# Patient Record
Sex: Female | Born: 1965 | Race: White | Hispanic: No | Marital: Single | Smoking: Never smoker
Health system: Southern US, Community
[De-identification: ages and names within clinical notes are randomized; demographics above are authoritative.]

## PROBLEM LIST (undated history)

## (undated) DIAGNOSIS — D649 Anemia, unspecified: Secondary | ICD-10-CM

## (undated) DIAGNOSIS — K649 Unspecified hemorrhoids: Secondary | ICD-10-CM

## (undated) DIAGNOSIS — E039 Hypothyroidism, unspecified: Secondary | ICD-10-CM

## (undated) HISTORY — DX: Anemia, unspecified: D64.9

## (undated) HISTORY — DX: Hypothyroidism, unspecified: E03.9

## (undated) HISTORY — DX: Unspecified hemorrhoids: K64.9

---

## 2001-08-26 HISTORY — PX: VAGINAL HYSTERECTOMY: SUR661

## 2016-07-01 ENCOUNTER — Encounter: Payer: Self-pay | Admitting: *Deleted

## 2016-07-03 ENCOUNTER — Telehealth: Payer: Self-pay

## 2016-07-03 NOTE — Telephone Encounter (Signed)
PT RECEIVED LETTER TO SCHEDULE TCS

## 2016-07-05 NOTE — Telephone Encounter (Signed)
I called pt and she is busy and will call Monday.

## 2016-07-11 ENCOUNTER — Encounter (INDEPENDENT_AMBULATORY_CARE_PROVIDER_SITE_OTHER): Payer: Self-pay

## 2016-07-11 ENCOUNTER — Encounter: Payer: Self-pay | Admitting: Adult Health

## 2016-07-11 ENCOUNTER — Ambulatory Visit (INDEPENDENT_AMBULATORY_CARE_PROVIDER_SITE_OTHER): Payer: BC Managed Care – PPO | Admitting: Adult Health

## 2016-07-11 VITALS — BP 112/82 | HR 60 | Ht 65.0 in | Wt 171.5 lb

## 2016-07-11 DIAGNOSIS — Z01419 Encounter for gynecological examination (general) (routine) without abnormal findings: Secondary | ICD-10-CM | POA: Diagnosis not present

## 2016-07-11 DIAGNOSIS — Z1212 Encounter for screening for malignant neoplasm of rectum: Secondary | ICD-10-CM | POA: Diagnosis not present

## 2016-07-11 DIAGNOSIS — R195 Other fecal abnormalities: Secondary | ICD-10-CM | POA: Diagnosis not present

## 2016-07-11 DIAGNOSIS — N951 Menopausal and female climacteric states: Secondary | ICD-10-CM

## 2016-07-11 LAB — HEMOCCULT GUIAC POC 1CARD (OFFICE): FECAL OCCULT BLD: POSITIVE — AB

## 2016-07-11 NOTE — Telephone Encounter (Signed)
Pt called back. She had a positive hemoccult at the PCP this week. OV with Walden Field, NP on 07/15/2016 at 1:30 Pm.

## 2016-07-11 NOTE — Patient Instructions (Addendum)
Physical in 1 year Mammogram yearly Labs with PCP Call me back with GI's name  Menopause Menopause is the normal time of life when menstrual periods stop completely. Menopause is complete when you have missed 12 consecutive menstrual periods. It usually occurs between the ages of 32 years and 36 years. Very rarely does a woman develop menopause before the age of 47 years. At menopause, your ovaries stop producing the female hormones estrogen and progesterone. This can cause undesirable symptoms and also affect your health. Sometimes the symptoms may occur 4-5 years before the menopause begins. There is no relationship between menopause and:  Oral contraceptives.  Number of children you had.  Race.  The age your menstrual periods started (menarche). Heavy smokers and very thin women may develop menopause earlier in life. What are the causes?  The ovaries stop producing the female hormones estrogen and progesterone. Other causes include:  Surgery to remove both ovaries.  The ovaries stop functioning for no known reason.  Tumors of the pituitary gland in the brain.  Medical disease that affects the ovaries and hormone production.  Radiation treatment to the abdomen or pelvis.  Chemotherapy that affects the ovaries. What are the signs or symptoms?  Hot flashes.  Night sweats.  Decrease in sex drive.  Vaginal dryness and thinning of the vagina causing painful intercourse.  Dryness of the skin and developing wrinkles.  Headaches.  Tiredness.  Irritability.  Memory problems.  Weight gain.  Bladder infections.  Hair growth of the face and chest.  Infertility. More serious symptoms include:  Loss of bone (osteoporosis) causing breaks (fractures).  Depression.  Hardening and narrowing of the arteries (atherosclerosis) causing heart attacks and strokes. How is this diagnosed?  When the menstrual periods have stopped for 12 straight months.  Physical  exam.  Hormone studies of the blood. How is this treated? There are many treatment choices and nearly as many questions about them. The decisions to treat or not to treat menopausal changes is an individual choice made with your health care provider. Your health care provider can discuss the treatments with you. Together, you can decide which treatment will work best for you. Your treatment choices may include:  Hormone therapy (estrogen and progesterone).  Non-hormonal medicines.  Treating the individual symptoms with medicine (for example antidepressants for depression).  Herbal medicines that may help specific symptoms.  Counseling by a psychiatrist or psychologist.  Group therapy.  Lifestyle changes including:  Eating healthy.  Regular exercise.  Limiting caffeine and alcohol.  Stress management and meditation.  No treatment. Follow these instructions at home:  Take the medicine your health care provider gives you as directed.  Get plenty of sleep and rest.  Exercise regularly.  Eat a diet that contains calcium (good for the bones) and soy products (acts like estrogen hormone).  Avoid alcoholic beverages.  Do not smoke.  If you have hot flashes, dress in layers.  Take supplements, calcium, and vitamin D to strengthen bones.  You can use over-the-counter lubricants or moisturizers for vaginal dryness.  Group therapy is sometimes very helpful.  Acupuncture may be helpful in some cases. Contact a health care provider if:  You are not sure you are in menopause.  You are having menopausal symptoms and need advice and treatment.  You are still having menstrual periods after age 60 years.  You have pain with intercourse.  Menopause is complete (no menstrual period for 12 months) and you develop vaginal bleeding.  You need a referral  to a specialist (gynecologist, psychiatrist, or psychologist) for treatment. Get help right away if:  You have severe  depression.  You have excessive vaginal bleeding.  You fell and think you have a broken bone.  You have pain when you urinate.  You develop leg or chest pain.  You have a fast pounding heart beat (palpitations).  You have severe headaches.  You develop vision problems.  You feel a lump in your breast.  You have abdominal pain or severe indigestion. This information is not intended to replace advice given to you by your health care provider. Make sure you discuss any questions you have with your health care provider. Document Released: 11/02/2003 Document Revised: 01/18/2016 Document Reviewed: 03/11/2013 Elsevier Interactive Patient Education  2017 [employer].

## 2016-07-11 NOTE — Telephone Encounter (Signed)
Pt is not where she can talk. She will call back this afternoon.

## 2016-07-11 NOTE — Progress Notes (Signed)
Patient ID: Kimberly Daugherty, female   DOB: September 29, 1965, 50 y.o.   MRN: [id number] History of Present Illness: Kimberly Daugherty is a 50 year old white female, new to this practice, in for well woman gyn exam, she is sp hysterectomy for fibroids about 12 years ago. 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   PCP Arsenio Katz, NP.   Current Medications, Allergies, Past Medical History, Past Surgical History, Family History and Social History were reviewed in [employer] record.     Review of Systems: Patient denies any headaches, hearing loss, fatigue, blurred vision, shortness of breath, chest pain, abdominal pain, problems with bowel movements, urination, or intercourse(not having sex). No joint pain or mood swings. + hot flashes, + weight gain, not sleeping well   Physical Exam:BP 112/82 (BP Location: Left Arm, Patient Position: Sitting, Cuff Size: Normal)   Pulse 60   Ht 5\' 5"  (1.651 m)   Wt 171 lb 8 oz (77.8 kg)   BMI 28.54 kg/m  General:  Well developed, well nourished, no acute distress Skin:  Warm and dry Neck:  Midline trachea, normal thyroid, good ROM, no lymphadenopathy Lungs; Clear to auscultation bilaterally Breast:  No dominant palpable mass, retraction, or nipple discharge Cardiovascular: Regular rate and rhythm Abdomen:  Soft, non tender, no hepatosplenomegaly Pelvic:  External genitalia is normal in appearance, no lesions.  The vagina is normal in appearance. Urethra has no lesions or masses. The cervix and uterus are absent.  No adnexal masses or tenderness noted.Bladder is non tender, no masses felt. Rectal: Good sphincter tone, no polyps, or hemorrhoids felt.  Hemoccult  positive. Extremities/musculoskeletal:  No swelling or varicosities noted, no clubbing or cyanosis Psych:  No mood changes, alert and cooperative,seems happy PHQ 2 score 0 Discussed risks and benefits of estrogen therapy and she declines for now, she wil call me back with name of GI so I can make them aware of + hemoccult.   Impression: 1. Well woman exam with routine gynecological exam   2. Positive fecal occult blood test   3. Hot flashes due to menopause       Plan: Physical in 1 year Mammogram yearly Labs with PCP Call me back with GI's name Review handout on menopause She declines flu shot, but I recommend shingles vaccine, will talk with PCP

## 2016-07-15 ENCOUNTER — Other Ambulatory Visit: Payer: Self-pay

## 2016-07-15 ENCOUNTER — Encounter: Payer: Self-pay | Admitting: Nurse Practitioner

## 2016-07-15 ENCOUNTER — Ambulatory Visit (INDEPENDENT_AMBULATORY_CARE_PROVIDER_SITE_OTHER): Payer: BC Managed Care – PPO | Admitting: Nurse Practitioner

## 2016-07-15 DIAGNOSIS — K921 Melena: Secondary | ICD-10-CM

## 2016-07-15 DIAGNOSIS — R195 Other fecal abnormalities: Secondary | ICD-10-CM | POA: Diagnosis not present

## 2016-07-15 MED ORDER — PEG-KCL-NACL-NASULF-NA ASC-C 100 G PO SOLR: 1.0000 | ORAL | 0 refills | Status: DC

## 2016-07-15 NOTE — Progress Notes (Signed)
cc'ed to pcp °

## 2016-07-15 NOTE — Progress Notes (Signed)
Primary Care Physician:  [facility] [facility] Primary Gastroenterologist:  Dr. Oneida Alar  Chief Complaint  Patient presents with  . Colonoscopy    never had TCS  . Blood In Stools    tested + at OBGYN    HPI:   Kimberly Daugherty is a 50 y.o. female who presents On referral from gynecology/primary care for colonoscopy after she received a letter to call and schedule an appointment. She is never had a colonoscopy before. Apparently, she tested positive for heme in her stool at her gynecologist on 07/11/2016. She is not aware of any problems.  Today she states she's doing well overall. Had heme+ stool at her GYN. At the time she felt dehydrated. No overt hemorrhoid symptoms but "on occasion it feels different so I may?" Denies abdominal pain, N/V, hematochezia, melena, fever, chills, unintentional weight loss. Bowel movements typically 3-4 on Bristol scale. Has a bowel movement about once a day. Denies chest pain, dyspnea, dizziness, lightheadedness, syncope, near syncope. Denies any other upper or lower GI symptoms.  Past Medical History:  Diagnosis Date  . Hypothyroid     Past Surgical History:  Procedure Laterality Date  . VAGINAL HYSTERECTOMY  2003    Current Outpatient Prescriptions  Medication Sig Dispense Refill  . levothyroxine (SYNTHROID, LEVOTHROID) 25 MCG tablet Take 25 mcg by mouth daily before breakfast.     No current facility-administered medications for this visit.     Allergies as of 07/15/2016  . (No Known Allergies)    Family History  Problem Relation Age of Onset  . Cancer Father     lung  . Leukemia Mother   . Other Mother     platelet disorder  . Hypothyroidism Sister   . Stroke Maternal Grandmother   . Stroke Maternal Grandfather   . Colon cancer Neg Hx     Social History   Social History  . Marital status: Single    Spouse name: N/A  . Number of children: N/A  . Years of education: N/A   Occupational History  . Not on file.   Social  History Main Topics  . Smoking status: Never Smoker  . Smokeless tobacco: Never Used  . Alcohol use Yes     Comment: Occasional, about once a week 1-2 drinks per sitting.  . Drug use: No  . Sexual activity: Not Currently    Birth control/ protection: Surgical     Comment: hyst   Other Topics Concern  . Not on file   Social History Narrative  . No narrative on file    Review of Systems: Complete ROS negative except as per HPI.    Physical Exam: BP (!) 138/95   Pulse 92   Temp 97.6 F (36.4 C) (Oral)   Ht 5\' 5"  (1.651 m)   Wt 171 lb 12.8 oz (77.9 kg)   BMI 28.59 kg/m  General:   Alert and oriented. Pleasant and cooperative. Well-nourished and well-developed.  Head:  Normocephalic and atraumatic. Eyes:  Without icterus, sclera clear and conjunctiva pink.  Ears:  Normal auditory acuity. Cardiovascular:  S1, S2 present without murmurs appreciated. Extremities without clubbing or edema. Respiratory:  Clear to auscultation bilaterally. No wheezes, rales, or rhonchi. No distress.  Gastrointestinal:  +BS, rounded but soft, non-tender and non-distended. No HSM noted. No guarding or rebound. No masses appreciated.  Rectal:  Deferred  Musculoskalatal:  Symmetrical without gross deformities. Neurologic:  Alert and oriented x4;  grossly normal neurologically. Psych:  Alert and cooperative.  Normal mood and affect. Heme/Lymph/Immune: No excessive bruising noted.    07/15/2016 1:57 PM   Disclaimer: This note was dictated with voice recognition software. Similar sounding words can inadvertently be transcribed and may not be corrected upon review.

## 2016-07-15 NOTE — Assessment & Plan Note (Signed)
The patient was initially scheduled for a phone triage based first-ever screening colonoscopy. However, she had heme positive stool at her gynecologist and an appointment was made. She denies hemorrhoid symptoms but states "I could have the my guess because sometimes thinks just feel not right." Has daily bowel movements consistent with Bristol 3-4. Denies drug when with constipation. Heme positive stool likely benign anorectal source but cannot rule out more insidious pathology. We will schedule colonoscopy at this time.  Proceed with colonoscopy with Dr. Oneida Alar in the near future. The risks, benefits, and alternatives have been discussed in detail with the patient. They state understanding and desire to proceed.   The patient is not on any anticoagulants, anxiolytics, chronic pain medications, or antidepressants. Conscious sedation should be adequate for her procedure.

## 2016-07-15 NOTE — Patient Instructions (Signed)
1. We will schedule your procedure for you. 2. Further recommendations to be made after your procedure. 3. Return for follow-up based on postprocedure recommendations.    Have a Happy Thanksgiving!!!

## 2016-07-24 ENCOUNTER — Telehealth: Payer: Self-pay | Admitting: Adult Health

## 2016-07-24 NOTE — Telephone Encounter (Signed)
Pt has appt for colonoscopy 12/18 with Dr Oneida Alar

## 2016-08-09 ENCOUNTER — Telehealth: Payer: Self-pay

## 2016-08-09 NOTE — Telephone Encounter (Signed)
Noted  

## 2016-08-09 NOTE — Telephone Encounter (Signed)
Pt left message on machine to inquire about her TCS. I tried to call her back but I had to leave a massage

## 2016-08-09 NOTE — Telephone Encounter (Signed)
Pt called and she would like to cancel her TCS because she will have to pay $1600.00. She stated that she has never had a TCS before so it should be a screening. I told her that she had blood in her stool that is why it is not a screening.

## 2016-08-12 ENCOUNTER — Encounter (HOSPITAL_COMMUNITY): Admission: RE | Payer: Self-pay | Source: Ambulatory Visit

## 2016-08-12 ENCOUNTER — Ambulatory Visit (HOSPITAL_COMMUNITY)
Admission: RE | Admit: 2016-08-12 | Payer: BC Managed Care – PPO | Source: Ambulatory Visit | Admitting: Gastroenterology

## 2016-08-12 SURGERY — COLONOSCOPY
Anesthesia: Moderate Sedation

## 2016-12-30 ENCOUNTER — Ambulatory Visit: Payer: BC Managed Care – PPO | Admitting: Orthopedic Surgery

## 2017-01-06 ENCOUNTER — Ambulatory Visit (INDEPENDENT_AMBULATORY_CARE_PROVIDER_SITE_OTHER): Payer: BC Managed Care – PPO | Admitting: Orthopedic Surgery

## 2017-01-06 ENCOUNTER — Encounter: Payer: Self-pay | Admitting: Orthopedic Surgery

## 2017-01-06 ENCOUNTER — Ambulatory Visit (INDEPENDENT_AMBULATORY_CARE_PROVIDER_SITE_OTHER): Payer: BC Managed Care – PPO

## 2017-01-06 VITALS — BP 152/83 | HR 74 | Ht 65.0 in | Wt 162.0 lb

## 2017-01-06 DIAGNOSIS — M25561 Pain in right knee: Secondary | ICD-10-CM

## 2017-01-06 DIAGNOSIS — M7051 Other bursitis of knee, right knee: Secondary | ICD-10-CM

## 2017-01-06 NOTE — Progress Notes (Signed)
NEW PATIENT OFFICE VISIT    Chief Complaint  Patient presents with  . 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 She gained some weight and that has some gotten better she lost about 10 pounds in her thyroid levels have finally started to stabilize. She presents to [place] with a rather new onset medial pain over the medial proximal tibia which is dull worse with activity worse after running partially relieved with ibuprofen. She had no major ligament injuries or prior surgeries to the right knee    Review of Systems  Constitutional: Negative for chills, fever and weight loss.  Respiratory: Negative for shortness of breath.   Cardiovascular: Negative for chest pain.  Neurological: Negative for tingling.     Past Medical History:  Diagnosis Date  . Hypothyroid     Past Surgical History:  Procedure Laterality Date  . VAGINAL HYSTERECTOMY  2003    Family History  Problem Relation Age of Onset  . Cancer Father        lung  . Leukemia Mother   . Other Mother        platelet disorder  . Hypothyroidism Sister   . Stroke Maternal Grandmother   . Stroke Maternal Grandfather   . Colon cancer Neg Hx    Social History  Substance Use Topics  . Smoking status: Never Smoker  . Smokeless tobacco: Never Used  . Alcohol use Yes     Comment: Occasional, about once a week 1-2 drinks per sitting.    There were no vitals taken for this visit.  Physical Exam  Constitutional: She is oriented to person, place, and time. She appears well-developed and well-nourished.  Musculoskeletal:       Right knee: She exhibits no effusion.       Left knee: She exhibits no effusion.  Neurological: She is alert and oriented to person, place, and time.  Psychiatric: She has a normal mood and affect.  Vitals reviewed.   Right Knee Exam   Tenderness  The patient is experiencing tenderness in the pes  anserinus.  Range of Motion  Extension: normal  Flexion: normal   Muscle Strength   The patient has normal right knee strength.  Tests  McMurray:  Medial - negative Lateral - negative Drawer:       Anterior - negative    Posterior - negative Varus: negative Valgus: negative  Other  Erythema: absent Scars: absent Sensation: normal Pulse: present Swelling: none Other tests: no effusion present   Left Knee Exam   Tenderness  The patient is experiencing no tenderness.     Range of Motion  Extension: normal  Flexion: normal   Muscle Strength   The patient has normal left knee strength.  Tests  McMurray:  Medial - negative Lateral - negative Drawer:       Anterior - negative     Posterior - negative Varus: negative Valgus: negative  Other  Erythema: absent Scars: absent Sensation: normal Pulse: present Swelling: none Effusion: no effusion present        Encounter Diagnoses  Name Primary?  . Right knee pain, unspecified chronicity   . Pes anserinus bursitis of right knee Yes   Plain films show symmetric mild joint space narrowing medial lateral compartment normal patellofemoral compartment  PLAN:   Alternate running surfaces and running path   Apply ice for 30 min immediately  after running   Use  ibuprofen as needed   Use maxfreeze or biofreeze as needed

## 2017-01-06 NOTE — Patient Instructions (Addendum)
Bursitis Bursitis is inflammation and irritation of a bursa, which is one of the small, fluid-filled sacs that cushion and protect the moving parts of your body. These sacs are located between bones and muscles, muscle attachments, or skin areas next to bones. A bursa protects these structures from the wear and tear that results from frequent movement. An inflamed bursa causes pain and swelling. Fluid may build up inside the sac. Bursitis is most common near joints, especially the knees, elbows, hips, and shoulders. What are the causes? Bursitis can be caused by:  Injury from:  A direct blow, like falling on your knee or elbow.  Overuse of a joint (repetitive stress).  Infection. This can happen if bacteria gets into a bursa through a cut or scrape near a joint.  Diseases that cause joint inflammation, such as gout and rheumatoid arthritis. What increases the risk? You may be at risk for bursitis if you:  Have a job or hobby that involves a lot of repetitive stress on your joints.  Have a condition that weakens your body's defense system (immune system), such as diabetes, cancer, or HIV.  Lift and reach overhead often.  Kneel or lean on hard surfaces often.  Run or walk often. What are the signs or symptoms? The most common signs and symptoms of bursitis are:  Pain that gets worse when you move the affected body part or put weight on it.  Inflammation.  Stiffness. Other signs and symptoms may include:  Redness.  Tenderness.  Warmth.  Pain that continues after rest.  Fever and chills. This may occur in bursitis caused by infection. How is this diagnosed? Bursitis may be diagnosed by:  Medical history and physical exam.  MRI.  A procedure to drain fluid from the bursa with a needle (aspiration). The fluid may be checked for signs of infection or gout.  Blood tests to rule out other causes of inflammation. How is this treated? Bursitis can usually be treated at  home with rest, ice, compression, and elevation (RICE). For mild bursitis, RICE treatment may be all you need. Other treatments may include:  Nonsteroidal anti-inflammatory drugs (NSAIDs) to treat pain and inflammation.  Corticosteroids to fight inflammation. You may have these drugs injected into and around the area of bursitis.  Aspiration of bursitis fluid to relieve pain and improve movement.  Antibiotic medicine to treat an infected bursa.  A splint, brace, or walking aid.  Physical therapy if you continue to have pain or limited movement.  Surgery to remove a damaged or infected bursa. This may be needed if you have a very bad case of bursitis or if other treatments have not worked. Follow these instructions at home:  Take medicines only as directed by your health care provider.  If you were prescribed an antibiotic medicine, finish it all even if you start to feel better.  Rest the affected area as directed by your health care provider.  Keep the area elevated.  Avoid activities that make pain worse.  Apply ice to the injured area:  Place ice in a plastic bag.  Place a towel between your skin and the bag.  Leave the ice on for 20 minutes, 2-3 times a day.  Use splints, braces, pads, or walking aids as directed by your health care provider.  Keep all follow-up visits as directed by your health care provider. This is important. How is this prevented?  Wear knee pads if you kneel often.  Wear sturdy running or walking shoes  that fit you well.  Take regular breaks from repetitive activity.  Warm up by stretching before doing any strenuous activity.  Maintain a healthy weight or lose weight as recommended by your health care provider. Ask your health care provider if you need help.  Exercise regularly. Start any new physical activity gradually. Contact a health care provider if:  Your bursitis is not responding to treatment or home care.  You have a  fever.  You have chills. This information is not intended to replace advice given to you by your health care provider. Make sure you discuss any questions you have with your health care provider. Document Released: 08/09/2000 Document Revised: 01/18/2016 Document Reviewed: 11/01/2013 Elsevier Interactive Patient Education  2017 [employer]   Alternate running surfaces and running path   Apply ice for 30 min immediately  after running   Use ibuprofen as needed   Use maxfreeze or biofreeze as needed

## 2017-06-27 ENCOUNTER — Encounter: Payer: Self-pay | Admitting: "Endocrinology

## 2017-12-04 ENCOUNTER — Encounter: Payer: Self-pay | Admitting: "Endocrinology

## 2018-01-05 ENCOUNTER — Encounter: Payer: Self-pay | Admitting: "Endocrinology

## 2018-01-05 ENCOUNTER — Ambulatory Visit (INDEPENDENT_AMBULATORY_CARE_PROVIDER_SITE_OTHER): Payer: BC Managed Care – PPO | Admitting: "Endocrinology

## 2018-01-05 VITALS — BP 122/78 | HR 59 | Ht 67.25 in | Wt 175.0 lb

## 2018-01-05 DIAGNOSIS — E039 Hypothyroidism, unspecified: Secondary | ICD-10-CM | POA: Diagnosis not present

## 2018-01-05 NOTE — Patient Instructions (Signed)
Advice for Weight Management -For most of [place] the best way to lose weight is by diet management. Generally speaking, diet management means restricting carbohydrate consumption to minimum possible (and to unprocessed or minimally processed complex starch) and increasing protein intake (animal or plant source), fruits, and vegetables. The overall plan is to keep negative energy balance.  -Sticking to a routine mealtime to eat 3 meals a day and avoiding unnecessary snacks is shown to have a big role in weight control.  -It is better to avoid simple carbohydrates including: Cakes, Sweet Desserts, Ice Cream, Soda (diet and regular), Sweet Tea, Candies, Chips, Cookies, Store Bought Juices, Alcohol in Excess of  1-2 drinks a day, Artificial Sweeteners, and "Sugar-free" Products.   -Exercise: 30 -60 minutes a day 3-4 days a week, or 150 minutes a week. Combine stretch, strength, and aerobic activities. You may seek evaluation by your heart doctor prior to initiating exercise if you have high risk for heart disease.  -If you are interested in discussing diet options/exchanges , we  can schedule a visit with Jearld Fenton, RDN, CDE for individualized nutrition education.

## 2018-01-05 NOTE — Progress Notes (Signed)
Endocrinology Consult Note                                            01/05/2018, 5:40 PM   Subjective:    Patient ID: Kimberly Daugherty, female    DOB: 04-18-1966, PCP Monico Blitz, MD   Past Medical History:  Diagnosis Date  . Hypothyroidism    Past Surgical History:  Procedure Laterality Date  . VAGINAL HYSTERECTOMY  2003   Social History   Socioeconomic History  . Marital status: Single    Spouse name: Not on file  . Number of children: Not on file  . Years of education: Not on file  . Highest education level: Not on file  Occupational History  . Not on file  Social Needs  . Financial resource strain: Not on file  . Food insecurity:    Worry: Not on file    Inability: Not on file  . Transportation needs:    Medical: Not on file    Non-medical: Not on file  Tobacco Use  . Smoking status: Never Smoker  . Smokeless tobacco: Never Used  Substance and Sexual Activity  . Alcohol use: Yes    Comment: Occasional, about once a week 1-2 drinks per sitting.  . Drug use: No  . Sexual activity: Not Currently    Birth control/protection: Surgical    Comment: hyst  Lifestyle  . Physical activity:    Days per week: Not on file    Minutes per session: Not on file  . Stress: Not on file  Relationships  . Social connections:    Talks on phone: Not on file    Gets together: Not on file    Attends religious service: Not on file    Active member of club or organization: Not on file    Attends meetings of clubs or organizations: Not on file    Relationship status: Not on file  Other Topics Concern  . Not on file  Social History Narrative  . Not on file   Outpatient Encounter Medications as of 01/05/2018  Medication Sig  . levothyroxine (SYNTHROID, LEVOTHROID) 25 MCG tablet Take 37.5 mcg by mouth daily before breakfast.  . [DISCONTINUED] peg 3350 powder (MOVIPREP) 100 g SOLR Take 1 kit (200 g total) by mouth as directed.   No facility-administered encounter  medications on file as of 01/05/2018.    ALLERGIES: No Known Allergies  VACCINATION STATUS:  There is no immunization history on file for this patient.  HPI Kimberly Daugherty is 52 y.o. female who presents today with a medical history as above. she is being seen in consultation for hypothyroidism requested by Monico Blitz, MD.  Her medical records are not available for review.  She states that she was diagnosed with hypothyroidism sometime in 2016 when she was in [place] , [place].  She was initiated on levothyroxine which was slowly adjusted to current dose of 37.5 mcg p.o. nightly.  She reports compliance to this medication.   -She complains of progressive weight gain, and fatigue.   -Review of her medical records revealed that in November 2017 she weighed 171 pounds, May 2018 162 pounds and currently 175 pounds. She denies heat/cold intolerance.  She denies dysphagia, odynophagia, voice change.  She reports unidentified thyroid problem in 1 of her siblings.  Denies any family history of thyroid  cancer.  She does not smoke.   Review of Systems  Constitutional: +  fluctuating body weight , + fatigue, no subjective hyperthermia, no subjective hypothermia Eyes: no blurry vision, no xerophthalmia ENT: no sore throat, no nodules palpated in throat, no dysphagia/odynophagia, no hoarseness Cardiovascular: no Chest Pain, no Shortness of Breath, no palpitations, no leg swelling Respiratory: no cough, no SOB Gastrointestinal: no Nausea/Vomiting/Diarhhea Musculoskeletal: no muscle/joint aches Skin: no rashes Neurological: no tremors, no numbness, no tingling, no dizziness Psychiatric: no depression, no anxiety  Objective:    BP 122/78   Pulse (!) 59   Ht 5' 7.25" (1.708 m)   Wt 175 lb (79.4 kg)   BMI 27.21 kg/m   Wt Readings from Last 3 Encounters:  01/05/18 175 lb (79.4 kg)  01/06/17 162 lb (73.5 kg)  07/15/16 171 lb 12.8 oz (77.9 kg)    Physical Exam  Constitutional: +  Slightly overweight for height, not in acute distress, normal state of mind Eyes: PERRLA, EOMI, no exophthalmos ENT: moist mucous membranes, no thyromegaly, no cervical lymphadenopathy Cardiovascular: normal precordial activity, Regular Rate and Rhythm, no Murmur/Rubs/Gallops Respiratory:  adequate breathing efforts, no gross chest deformity, Clear to auscultation bilaterally Gastrointestinal: abdomen soft, Non -tender, No distension, Bowel Sounds present Musculoskeletal: no gross deformities, strength intact in all four extremities Skin: moist, warm, no rashes Neurological: no tremor with outstretched hands, Deep tendon reflexes normal in all four extremities.  On June 28, 2017 her TSH was 2.7, on December 05, 2017 TSH was 2.79.   Assessment & Plan:   1. Hypothyroidism, unspecified type - Kimberly Daugherty  is being seen at a kind request of Monico Blitz, MD. - I have reviewed her available thyroid records and clinically evaluated the patient. - Based on reviews, she appears to have established diagnosis of hypothyroidism on low-dose levothyroxine,   however,  there is not sufficient information to proceed with definitive treatment plan adjustment. - she will need a repeat,  more complete thyroid function test towards confirming the diagnosis, and make changes if necessary. -She will be sent for lab for TSH, free T4/free T3, and antithyroid antibodies.  -she will return in 1week to review her repeat labs.  - I did not initiate any new prescriptions today. -I advised her to continue levothyroxine 37.5 mcg p.o. daily.  - We discussed about correct intake of levothyroxine, at fasting, with water, separated by at least 30 minutes from breakfast, and separated by more than 4 hours from calcium, iron, multivitamins, acid reflux medications (PPIs). -Patient is made aware of the fact that thyroid hormone replacement is needed for life, dose to be adjusted by periodic monitoring of thyroid function  tests.  -Due to absence of clinical goiter, thyroid imaging is not needed at this time.  Regarding her weight concern: -Likely due to positive caloric intake versus slow metabolic.  She has a few choices to address the weight concern, always better to start with caloric restrictions. --  Suggestion is made for her to avoid simple carbohydrates  from her diet including Cakes, Sweet Desserts / Pastries, Ice Cream, Soda (diet and regular), Sweet Tea, Candies, Chips, Cookies, Store Bought Juices, Alcohol in Excess of  1-2 drinks a day, Artificial Sweeteners, and "Sugar-free" Products. This will help patient to have stable blood glucose profile and potentially avoid unintended weight gain. -If she has appropriate insurance, she will be considered for 1 of the recently approved weight loss medications.  - I advised her  to maintain close follow up  with Monico Blitz, MD for primary care needs.    Follow up plan: Return in about 1 week (around 01/12/2018) for follow up with pre-visit labs.   Glade Lloyd, MD [facility] [address] [place], [place] [address] Phone: [phone]  Fax: [phone]     01/05/2018, 5:40 PM  This note was partially dictated with voice recognition software. Similar sounding words can be transcribed inadequately or may not  be corrected upon review.

## 2018-01-06 LAB — THYROID PEROXIDASE ANTIBODY: Thyroperoxidase Ab SerPl-aCnc: 76 IU/mL — ABNORMAL HIGH (ref <9)

## 2018-01-06 LAB — T3, FREE: T3 FREE: 2.8 pg/mL (ref 2.3–4.2)

## 2018-01-06 LAB — TSH: TSH: 1.84 m[IU]/L

## 2018-01-06 LAB — T4, FREE: Free T4: 1.2 ng/dL (ref 0.8–1.8)

## 2018-01-06 LAB — THYROGLOBULIN ANTIBODY: Thyroglobulin Ab: 3 IU/mL — ABNORMAL HIGH (ref <=1)

## 2018-01-13 ENCOUNTER — Encounter: Payer: Self-pay | Admitting: "Endocrinology

## 2018-01-13 ENCOUNTER — Ambulatory Visit (INDEPENDENT_AMBULATORY_CARE_PROVIDER_SITE_OTHER): Payer: BC Managed Care – PPO | Admitting: "Endocrinology

## 2018-01-13 VITALS — BP 133/87 | HR 66 | Ht 67.25 in | Wt 174.0 lb

## 2018-01-13 DIAGNOSIS — E038 Other specified hypothyroidism: Secondary | ICD-10-CM | POA: Diagnosis not present

## 2018-01-13 DIAGNOSIS — E063 Autoimmune thyroiditis: Secondary | ICD-10-CM | POA: Diagnosis not present

## 2018-01-13 MED ORDER — LEVOTHYROXINE SODIUM 50 MCG PO TABS: 50.0000 ug | ORAL_TABLET | Freq: Every day | ORAL | 4 refills | Status: DC

## 2018-01-13 NOTE — Progress Notes (Signed)
Endocrinology follow-up note                                            01/13/2018, 1:09 PM   Subjective:    Patient ID: Kimberly Daugherty, female    DOB: 1965/09/22, PCP Kimberly Blitz, MD   Past Medical History:  Diagnosis Date  . Hypothyroidism    Past Surgical History:  Procedure Laterality Date  . VAGINAL HYSTERECTOMY  2003   Social History   Socioeconomic History  . Marital status: Single    Spouse name: Not on file  . Number of children: Not on file  . Years of education: Not on file  . Highest education level: Not on file  Occupational History  . Not on file  Social Needs  . Financial resource strain: Not on file  . Food insecurity:    Worry: Not on file    Inability: Not on file  . Transportation needs:    Medical: Not on file    Non-medical: Not on file  Tobacco Use  . Smoking status: Never Smoker  . Smokeless tobacco: Never Used  Substance and Sexual Activity  . Alcohol use: Yes    Comment: Occasional, about once a week 1-2 drinks per sitting.  . Drug use: No  . Sexual activity: Not Currently    Birth control/protection: Surgical    Comment: hyst  Lifestyle  . Physical activity:    Days per week: Not on file    Minutes per session: Not on file  . Stress: Not on file  Relationships  . Social connections:    Talks on phone: Not on file    Gets together: Not on file    Attends religious service: Not on file    Active member of club or organization: Not on file    Attends meetings of clubs or organizations: Not on file    Relationship status: Not on file  Other Topics Concern  . Not on file  Social History Narrative  . Not on file   Outpatient Encounter Medications as of 01/13/2018  Medication Sig  . levothyroxine (SYNTHROID, LEVOTHROID) 50 MCG tablet Take 1 tablet (50 mcg total) by mouth daily before breakfast.  . [DISCONTINUED] levothyroxine (SYNTHROID, LEVOTHROID) 25 MCG tablet Take 37.5 mcg by mouth daily before breakfast.   No  facility-administered encounter medications on file as of 01/13/2018.    ALLERGIES: No Known Allergies  VACCINATION STATUS:  There is no immunization history on file for this patient.  HPI Kimberly Daugherty is 52 y.o. female who presents today with a medical history as above. she is here to follow-up for hypothyroidism after she was seen in consultation last week.  She states that she was diagnosed with hypothyroidism sometime in 2016 when she was in [place] , [place].  She was initiated on levothyroxine which was slowly adjusted to current dose of 37.5 mcg p.o. nightly.  She reports compliance to this medication.   -She complains of progressive weight gain, and fatigue.   -Review of her medical records revealed that in November 2017 she weighed 171 pounds, May 2018 162 pounds and currently 175 pounds. She denies heat/cold intolerance.  She denies dysphagia, odynophagia, voice change.  She reports unidentified thyroid problem in 1 of her siblings.  Denies any family history of thyroid cancer.  She does not smoke.  She has no new complaints today.   Review of Systems  Constitutional: +  fluctuating body weight , + fatigue, no subjective hyperthermia, no subjective hypothermia Eyes: no blurry vision, no xerophthalmia ENT: no sore throat, no nodules palpated in throat, no dysphagia/odynophagia, no hoarseness Cardiovascular: no Chest Pain, no Shortness of Breath, no palpitations, no leg swelling Respiratory: no cough, no SOB Gastrointestinal: no Nausea/Vomiting/Diarhhea Musculoskeletal: no muscle/joint aches Skin: no rashes Neurological: no tremors, no numbness, no tingling, no dizziness Psychiatric: no depression, no anxiety  Objective:    BP 133/87   Pulse 66   Ht 5' 7.25" (1.708 m)   Wt 174 lb (78.9 kg)   BMI 27.05 kg/m   Wt Readings from Last 3 Encounters:  01/13/18 174 lb (78.9 kg)  01/05/18 175 lb (79.4 kg)  01/06/17 162 lb (73.5 kg)    Physical Exam  Constitutional:  + Slightly overweight for height,  not in acute distress, normal state of mind Eyes: PERRLA, EOMI, no exophthalmos ENT: moist mucous membranes, + mild thyromegaly, no cervical lymphadenopathy Musculoskeletal: no gross deformities, strength intact in all four extremities Skin: moist, warm, no rashes Neurological: no tremor with outstretched hands, Deep tendon reflexes normal in all four extremities.  On June 28, 2017 her TSH was 2.7, on December 05, 2017 TSH was 2.79.  Recent Results (from the past 2160 hour(s))  TSH     Status: None   Collection Time: 01/05/18  3:41 PM  Result Value Ref Range   TSH 1.84 mIU/L    Comment:           Reference Range .           > or = 20 Years  0.40-4.50 .                Pregnancy Ranges           First trimester    0.26-2.66           Second trimester   0.55-2.73           Third trimester    0.43-2.91   T4, free     Status: None   Collection Time: 01/05/18  3:41 PM  Result Value Ref Range   Free T4 1.2 0.8 - 1.8 ng/dL  T3, free     Status: None   Collection Time: 01/05/18  3:41 PM  Result Value Ref Range   T3, Free 2.8 2.3 - 4.2 pg/mL  Thyroid peroxidase antibody     Status: Abnormal   Collection Time: 01/05/18  3:41 PM  Result Value Ref Range   Thyroperoxidase Ab SerPl-aCnc 76 (H) <9 IU/mL  Thyroglobulin antibody     Status: Abnormal   Collection Time: 01/05/18  3:41 PM  Result Value Ref Range   Thyroglobulin Ab 3 (H) < or = 1 IU/mL     Assessment & Plan:   1. Hypothyroidism due to Hashimoto's thyroiditis -Her recent repeat labs revealed hypothyroidism due to Hashimoto's thyroiditis. -She would benefit from slight increase in her levothyroxine dose. -I discussed and increase her dose of levothyroxine to 50 mcg p.o. every morning.  - We discussed about correct intake of levothyroxine, at fasting, with water, separated by at least 30 minutes from breakfast, and separated by more than 4 hours from calcium, iron, multivitamins, acid reflux  medications (PPIs). -Patient is made aware of the fact that thyroid hormone replacement is needed for life, dose to be adjusted by periodic monitoring of thyroid function tests.  -  Due to evidence of Hashimoto's thyroiditis and palpable thyromegaly, I offered her baseline thyroid/neck ultrasound.   Regarding her weight concern: -Likely due to positive caloric intake versus slow metabolic.  She has a few choices to address the weight concern, always better to start with caloric restrictions. --  Suggestion is made for her to avoid simple carbohydrates  from her diet including Cakes, Sweet Desserts / Pastries, Ice Cream, Soda (diet and regular), Sweet Tea, Candies, Chips, Cookies, Store Bought Juices, Alcohol in Excess of  1-2 drinks a day, Artificial Sweeteners, and "Sugar-free" Products. This will help patient to have stable blood glucose profile and potentially avoid unintended weight gain. -If she has appropriate insurance, she will be considered for 1 of the recently approved weight loss medications.  - I advised her  to maintain close follow up with Kimberly Blitz, MD for primary care needs.  Follow up plan: Return in about 4 months (around 05/16/2018) for follow up with pre-visit labs, Thyroid / Neck Ultrasound.   Glade Lloyd, MD [facility] [address] [place], [place] [address] Phone: [phone]  Fax: [phone]     01/13/2018, 1:09 PM  This note was partially dictated with voice recognition software. Similar sounding words can be transcribed inadequately or may not  be corrected upon review.

## 2018-01-27 ENCOUNTER — Ambulatory Visit (HOSPITAL_COMMUNITY): Admission: RE | Admit: 2018-01-27 | Payer: BC Managed Care – PPO | Source: Ambulatory Visit

## 2018-03-17 ENCOUNTER — Ambulatory Visit (HOSPITAL_COMMUNITY): Payer: BC Managed Care – PPO

## 2018-03-24 ENCOUNTER — Ambulatory Visit (HOSPITAL_COMMUNITY)
Admission: RE | Admit: 2018-03-24 | Discharge: 2018-03-24 | Disposition: A | Discharge status: Home / Self Care | Payer: BC Managed Care – PPO | Source: Ambulatory Visit | Attending: "Endocrinology | Admitting: "Endocrinology

## 2018-03-24 DIAGNOSIS — E038 Other specified hypothyroidism: Secondary | ICD-10-CM | POA: Insufficient documentation

## 2018-03-24 DIAGNOSIS — E063 Autoimmune thyroiditis: Secondary | ICD-10-CM | POA: Diagnosis present

## 2018-05-15 ENCOUNTER — Other Ambulatory Visit: Payer: Self-pay | Admitting: "Endocrinology

## 2018-05-19 ENCOUNTER — Encounter: Payer: Self-pay | Admitting: "Endocrinology

## 2018-05-19 ENCOUNTER — Ambulatory Visit (INDEPENDENT_AMBULATORY_CARE_PROVIDER_SITE_OTHER): Payer: BC Managed Care – PPO | Admitting: "Endocrinology

## 2018-05-19 VITALS — BP 117/80 | HR 72 | Ht 65.0 in | Wt 183.0 lb

## 2018-05-19 DIAGNOSIS — E6609 Other obesity due to excess calories: Secondary | ICD-10-CM | POA: Diagnosis not present

## 2018-05-19 DIAGNOSIS — E038 Other specified hypothyroidism: Secondary | ICD-10-CM

## 2018-05-19 DIAGNOSIS — E063 Autoimmune thyroiditis: Secondary | ICD-10-CM | POA: Diagnosis not present

## 2018-05-19 DIAGNOSIS — Z6830 Body mass index (BMI) 30.0-30.9, adult: Secondary | ICD-10-CM | POA: Diagnosis not present

## 2018-05-19 MED ORDER — PHENTERMINE-TOPIRAMATE ER 7.5-46 MG PO CP24: 1.0000 | ORAL_CAPSULE | Freq: Every day | ORAL | 2 refills | Status: DC

## 2018-05-19 MED ORDER — PHENTERMINE-TOPIRAMATE ER 3.75-23 MG PO CP24: 1.0000 | ORAL_CAPSULE | Freq: Every day | ORAL | 0 refills | Status: DC

## 2018-05-19 NOTE — Progress Notes (Signed)
Endocrinology follow-up note                                            05/19/2018, 1:33 PM   Subjective:    Patient ID: Kimberly Daugherty, female    DOB: 02/28/1966, PCP Kimberly Blitz, MD   Past Medical History:  Diagnosis Date  . Hypothyroidism    Past Surgical History:  Procedure Laterality Date  . VAGINAL HYSTERECTOMY  2003   Social History   Socioeconomic History  . Marital status: Single    Spouse name: Not on file  . Number of children: Not on file  . Years of education: Not on file  . Highest education level: Not on file  Occupational History  . Not on file  Social Needs  . Financial resource strain: Not on file  . Food insecurity:    Worry: Not on file    Inability: Not on file  . Transportation needs:    Medical: Not on file    Non-medical: Not on file  Tobacco Use  . Smoking status: Never Smoker  . Smokeless tobacco: Never Used  Substance and Sexual Activity  . Alcohol use: Yes    Comment: Occasional, about once a week 1-2 drinks per sitting.  . Drug use: No  . Sexual activity: Not Currently    Birth control/protection: Surgical    Comment: hyst  Lifestyle  . Physical activity:    Days per week: Not on file    Minutes per session: Not on file  . Stress: Not on file  Relationships  . Social connections:    Talks on phone: Not on file    Gets together: Not on file    Attends religious service: Not on file    Active member of club or organization: Not on file    Attends meetings of clubs or organizations: Not on file    Relationship status: Not on file  Other Topics Concern  . Not on file  Social History Narrative  . Not on file   Outpatient Encounter Medications as of 05/19/2018  Medication Sig  . Phentermine-Topiramate (QSYMIA) 7.5-46 MG CP24 Take 1 capsule by mouth daily.  . Phentermine-Topiramate 3.75-23 MG CP24 Take 1 capsule by mouth daily.  . [DISCONTINUED] levothyroxine (SYNTHROID, LEVOTHROID) 50 MCG tablet Take 1 tablet (50 mcg  total) by mouth daily before breakfast.   No facility-administered encounter medications on file as of 05/19/2018.    ALLERGIES: No Known Allergies  VACCINATION STATUS:  There is no immunization history on file for this patient.  HPI Kimberly Daugherty is 52 y.o. female who presents today with a medical history as above. she is here to follow-up for hypothyroidism with recent thyroid ultrasound.    She missed her appointment for previsit thyroid function tests.   -She is currently on levothyroxine 50 mcg p.o. every morning for Hashimoto's related hypothyroidism diagnosed in 2016.  -She reports compliance to this medication.  -She complains of progressive weight gain, and fatigue.   -Review of her medical records revealed that in November 2017 she weighed 171 pounds, May 2018 162 pounds,  175 pounds during her last visit and 183 pounds today.-Her major concern today. She denies heat/cold intolerance.  She denies dysphagia, odynophagia, voice change.  She reports unidentified thyroid problem in 1 of her siblings.  Denies any family history of thyroid  cancer.  She does not smoke.      Review of Systems  Constitutional: + progressive weight gain,, + fatigue, no subjective hyperthermia, no subjective hypothermia Eyes: no blurry vision, no xerophthalmia ENT: no sore throat, no nodules palpated in throat, no dysphagia/odynophagia, no hoarseness Cardiovascular: no Chest Pain, no Shortness of Breath, no palpitations, no leg swelling Respiratory: no cough, no SOB Gastrointestinal: no Nausea/Vomiting/Diarhhea Musculoskeletal: no muscle/joint aches Skin: no rashes Neurological: no tremors, no numbness, no tingling, no dizziness Psychiatric: no depression, no anxiety  Objective:    BP 117/80   Pulse 72   Ht 5\' 5"  (1.651 m)   Wt 183 lb (83 kg)   BMI 30.45 kg/m   Wt Readings from Last 3 Encounters:  05/19/18 183 lb (83 kg)  01/13/18 174 lb (78.9 kg)  01/05/18 175 lb (79.4 kg)    Physical  Exam  Constitutional: + Class I obesity,   not in acute distress, normal state of mind Eyes: PERRLA, EOMI, no exophthalmos ENT: moist mucous membranes, + mild thyromegaly, no cervical lymphadenopathy Musculoskeletal: no gross deformities, strength intact in all four extremities Skin: moist, warm, no rashes Neurological: no tremor with outstretched hands, Deep tendon reflexes normal in all four extremities.  On June 28, 2017 her TSH was 2.7, on December 05, 2017 TSH was 2.79.  Her thyroid ultrasound is remarkable for 0.7 cm nodule in the right lobe.  Assessment & Plan:   1. Hypothyroidism due to Hashimoto's thyroiditis -She will be sent to lab for new set of thyroid function tests.   -She will be contacted if she needs adjustment on her levothyroxine dose.  In the meantime she is advised to continue levothyroxine 50 mcg p.o. every morning.   - We discussed about correct intake of levothyroxine, at fasting, with water, separated by at least 30 minutes from breakfast, and separated by more than 4 hours from calcium, iron, multivitamins, acid reflux medications (PPIs). -Patient is made aware of the fact that thyroid hormone replacement is needed for life, dose to be adjusted by periodic monitoring of thyroid function tests. -Her thyroid ultrasound is unremarkable at this time.  She will need repeat thyroid ultrasound in 1-2 years for monitoring the 0.7 cm nodule on the right lobe.     Regarding her weight concern: -Likely due to positive caloric intake versus slow metabolic.  She has maximized her personal efforts to control weight.  Wishes to be considered for the next option of treatment.  She has no contraindications for medical therapy at this time.  -I discussed and initiated Qsymia meant with her: Starting with 3.75 mg daily for 14 days followed by 7.5 mg daily for the following 60 days with plan to return for reevaluation in 3 months.    -  Suggestion is made for her to avoid simple  carbohydrates  from her diet including Cakes, Sweet Desserts / Pastries, Ice Cream, Soda (diet and regular), Sweet Tea, Candies, Chips, Cookies, Store Bought Juices, Alcohol in Excess of  1-2 drinks a day, Artificial Sweeteners, and "Sugar-free" Products. This will help patient to have stable blood glucose profile and potentially avoid unintended weight gain.   - I advised her  to maintain close follow up with Kimberly Blitz, MD for primary care needs.  Follow up plan: Return in about 3 months (around 08/18/2018) for Labs Today- Non-Fasting Ok, Follow up with Pre-visit Labs.   Glade Lloyd, MD [facility] [address] [place], [place] [address]  Phone: [phone]  Fax: [phone]     05/19/2018, 1:33 PM  This note was partially dictated with voice recognition software. Similar sounding words can be transcribed inadequately or may not  be corrected upon review.

## 2018-05-20 ENCOUNTER — Other Ambulatory Visit: Payer: Self-pay | Admitting: "Endocrinology

## 2018-05-20 LAB — COMPLETE METABOLIC PANEL WITH GFR
AG RATIO: 1.5 (calc) (ref 1.0–2.5)
ALBUMIN MSPROF: 3.8 g/dL (ref 3.6–5.1)
ALKALINE PHOSPHATASE (APISO): 66 U/L (ref 33–130)
ALT: 23 U/L (ref 6–29)
AST: 20 U/L (ref 10–35)
BILIRUBIN TOTAL: 0.4 mg/dL (ref 0.2–1.2)
BUN: 15 mg/dL (ref 7–25)
CHLORIDE: 107 mmol/L (ref 98–110)
CO2: 26 mmol/L (ref 20–32)
Calcium: 9.3 mg/dL (ref 8.6–10.4)
Creat: 0.68 mg/dL (ref 0.50–1.05)
GFR, Est African American: 115 mL/min/{1.73_m2} (ref >=60)
GFR, Est Non African American: 99 mL/min/{1.73_m2} (ref >=60)
GLUCOSE: 66 mg/dL (ref 65–139)
Globulin: 2.5 g/dL (calc) (ref 1.9–3.7)
POTASSIUM: 4 mmol/L (ref 3.5–5.3)
Sodium: 140 mmol/L (ref 135–146)
Total Protein: 6.3 g/dL (ref 6.1–8.1)

## 2018-05-20 LAB — HEMOGLOBIN A1C
Hgb A1c MFr Bld: 5.3 % of total Hgb (ref <5.7)
Mean Plasma Glucose: 105 (calc)
eAG (mmol/L): 5.8 (calc)

## 2018-05-20 LAB — TSH: TSH: 2.73 mIU/L

## 2018-05-20 LAB — T4, FREE: FREE T4: 1.1 ng/dL (ref 0.8–1.8)

## 2018-05-20 MED ORDER — LEVOTHYROXINE SODIUM 75 MCG PO TABS: 75.0000 ug | ORAL_TABLET | Freq: Every day | ORAL | 3 refills | Status: DC

## 2018-05-27 ENCOUNTER — Telehealth: Payer: Self-pay | Admitting: "Endocrinology

## 2018-05-27 ENCOUNTER — Other Ambulatory Visit: Payer: Self-pay | Admitting: "Endocrinology

## 2018-05-27 MED ORDER — LORCASERIN HCL ER 20 MG PO TB24: 20.0000 mg | ORAL_TABLET | Freq: Every day | ORAL | 2 refills | Status: AC

## 2018-05-27 NOTE — Telephone Encounter (Signed)
Pts insurance company denied Qsymia. She is asking if there is another medication she can try.

## 2018-05-27 NOTE — Telephone Encounter (Signed)
Kimberly Daugherty is calling statin that the pharmacy is not hearing back from the office in regards to a prior approval for medication that Dr. Dorris Fetch prescribed , please advise?

## 2018-05-27 NOTE — Telephone Encounter (Signed)
It is possible that the insurance will reject the other rx as well, she can pick up belviq rx.

## 2018-05-27 NOTE — Telephone Encounter (Signed)
Pt.notified

## 2018-07-06 ENCOUNTER — Encounter: Payer: Self-pay | Admitting: Gastroenterology

## 2018-07-28 ENCOUNTER — Encounter: Payer: Self-pay | Admitting: Gastroenterology

## 2018-07-28 ENCOUNTER — Ambulatory Visit (AMBULATORY_SURGERY_CENTER): Payer: Self-pay | Admitting: *Deleted

## 2018-07-28 VITALS — Ht 65.0 in | Wt 175.0 lb

## 2018-07-28 DIAGNOSIS — Z1211 Encounter for screening for malignant neoplasm of colon: Secondary | ICD-10-CM

## 2018-07-28 MED ORDER — NA SULFATE-K SULFATE-MG SULF 17.5-3.13-1.6 GM/177ML PO SOLN: 1.0000 | Freq: Once | ORAL | 0 refills | Status: AC

## 2018-07-28 NOTE — Progress Notes (Signed)
No egg or soy allergy known to patient  No issues with past sedation with any surgeries  or procedures, no intubation problems  On  diet pills per patient - pt on Phentermine she takes every other day - instructed to stop the Phentermine today which is 10 days before the procedure No home 02 use per patient  No blood thinners per patient  Pt denies issues with constipation  No A fib or A flutter  EMMI video sent to pt's e mail -  suprep $15 coupon to pt

## 2018-08-07 ENCOUNTER — Ambulatory Visit (AMBULATORY_SURGERY_CENTER): Payer: BC Managed Care – PPO | Admitting: Gastroenterology

## 2018-08-07 ENCOUNTER — Encounter: Payer: Self-pay | Admitting: Gastroenterology

## 2018-08-07 VITALS — BP 104/77 | HR 64 | Temp 98.0°F | Resp 12 | Ht 65.0 in | Wt 183.0 lb

## 2018-08-07 DIAGNOSIS — Z1211 Encounter for screening for malignant neoplasm of colon: Secondary | ICD-10-CM | POA: Diagnosis not present

## 2018-08-07 DIAGNOSIS — D120 Benign neoplasm of cecum: Secondary | ICD-10-CM

## 2018-08-07 MED ORDER — SODIUM CHLORIDE 0.9 % IV SOLN: 500.0000 mL | Freq: Once | INTRAVENOUS | Status: AC

## 2018-08-07 NOTE — Progress Notes (Signed)
To recovery, report to RN, VSS. 

## 2018-08-07 NOTE — Op Note (Signed)
[place] Patient Name: Kimberly Daugherty Procedure Date: 08/07/2018 1:49 PM MRN: [id number] Endoscopist: Mauri Pole , MD Age: 52 Referring MD:  Date of Birth: 1966-01-13 Gender: Female Account #: [id number] Procedure:                Colonoscopy Indications:              Screening for colorectal malignant neoplasm, This                            is the patient's first colonoscopy Medicines:                Monitored Anesthesia Care Procedure:                Pre-Anesthesia Assessment:                           - Prior to the procedure, a History and Physical                            was performed, and patient medications and                            allergies were reviewed. The patient's tolerance of                            previous anesthesia was also reviewed. The risks                            and benefits of the procedure and the sedation                            options and risks were discussed with the patient.                            All questions were answered, and informed consent                            was obtained. Prior Anticoagulants: The patient has                            taken no previous anticoagulant or antiplatelet                            agents. ASA Grade Assessment: II - A patient with                            mild systemic disease. After reviewing the risks                            and benefits, the patient was deemed in                            satisfactory condition to undergo the procedure.  After obtaining informed consent, the colonoscope                            was passed under direct vision. Throughout the                            procedure, the patient's blood pressure, pulse, and                            oxygen saturations were monitored continuously. The                            Colonoscope was introduced through the anus and                            advanced to the the  cecum, identified by                            appendiceal orifice and ileocecal valve. The                            colonoscopy was performed without difficulty. The                            patient tolerated the procedure well. The quality                            of the bowel preparation was good. The ileocecal                            valve, appendiceal orifice, and rectum were                            photographed. Scope In: 1:55:41 PM Scope Out: 2:19:08 PM Scope Withdrawal Time: 0 hours 9 minutes 57 seconds  Total Procedure Duration: 0 hours 23 minutes 27 seconds  Findings:                 The perianal and digital rectal examinations were                            normal.                           A 1 mm polyp was found in the cecum. The polyp was                            sessile. The polyp was removed with a cold biopsy                            forceps. Resection and retrieval were complete.                           There was a small lipoma, 10 mm in diameter, in the  transverse colon.                           Non-bleeding internal hemorrhoids were found during                            retroflexion. The hemorrhoids were small.                           The exam was otherwise without abnormality. Complications:            No immediate complications. Estimated Blood Loss:     Estimated blood loss was minimal. Impression:               - One 1 mm polyp in the cecum, removed with a cold                            biopsy forceps. Resected and retrieved.                           - Small lipoma in the transverse colon.                           - Non-bleeding internal hemorrhoids.                           - The examination was otherwise normal. Recommendation:           - Patient has a contact number available for                            emergencies. The signs and symptoms of potential                            delayed complications  were discussed with the                            patient. Return to normal activities tomorrow.                            Written discharge instructions were provided to the                            patient.                           - Resume previous diet.                           - Continue present medications.                           - Await pathology results.                           - Repeat colonoscopy in 5-10 years for surveillance  based on pathology results. Mauri Pole, MD 08/07/2018 2:25:25 PM This report has been signed electronically.

## 2018-08-07 NOTE — Progress Notes (Signed)
Called to room to assist during endoscopic procedure.  Patient ID and intended procedure confirmed with present staff. Received instructions for my participation in the procedure from the performing physician.  

## 2018-08-07 NOTE — Patient Instructions (Signed)
Impression/Recommendations:  Polyp handout given to patient. Hemorrhoid handout given to patient,.  Resume previous diet. Continue present medications.  Repeat colonoscopy in 5-10 years based on pathology results.  YOU HAD AN ENDOSCOPIC PROCEDURE TODAY AT [facility]:   Refer to the procedure report that was given to you for any specific questions about what was found during the examination.  If the procedure report does not answer your questions, please call your gastroenterologist to clarify.  If you requested that your care partner not be given the details of your procedure findings, then the procedure report has been included in a sealed envelope for you to review at your convenience later.  YOU SHOULD EXPECT: Some feelings of bloating in the abdomen. Passage of more gas than usual.  Walking can help get rid of the air that was put into your GI tract during the procedure and reduce the bloating. If you had a lower endoscopy (such as a colonoscopy or flexible sigmoidoscopy) you may notice spotting of blood in your stool or on the toilet paper. If you underwent a bowel prep for your procedure, you may not have a normal bowel movement for a few days.  Please Note:  You might notice some irritation and congestion in your nose or some drainage.  This is from the oxygen used during your procedure.  There is no need for concern and it should clear up in a day or so.  SYMPTOMS TO REPORT IMMEDIATELY:   Following lower endoscopy (colonoscopy or flexible sigmoidoscopy):  Excessive amounts of blood in the stool  Significant tenderness or worsening of abdominal pains  Swelling of the abdomen that is new, acute  Fever of 100F or higher  For urgent or emergent issues, a gastroenterologist can be reached at any hour by calling [phone].   DIET:  We do recommend a small meal at first, but then you may proceed to your regular diet.  Drink plenty of fluids but you should avoid  alcoholic beverages for 24 hours.  ACTIVITY:  You should plan to take it easy for the rest of today and you should NOT DRIVE or use heavy machinery until tomorrow (because of the sedation medicines used during the test).    FOLLOW UP: Our staff will call the number listed on your records the next business day following your procedure to check on you and address any questions or concerns that you may have regarding the information given to you following your procedure. If we do not reach you, we will leave a message.  However, if you are feeling well and you are not experiencing any problems, there is no need to return our call.  We will assume that you have returned to your regular daily activities without incident.  If any biopsies were taken you will be contacted by phone or by letter within the next 1-3 weeks.  Please call us at [phone] if you have not heard about the biopsies in 3 weeks.    SIGNATURES/CONFIDENTIALITY: You and/or your care partner have signed paperwork which will be entered into your electronic medical record.  These signatures attest to the fact that that the information above on your After Visit Summary has been reviewed and is understood.  Full responsibility of the confidentiality of this discharge information lies with you and/or your care-partner.

## 2018-08-10 ENCOUNTER — Telehealth: Payer: Self-pay | Admitting: *Deleted

## 2018-08-10 NOTE — Telephone Encounter (Signed)
  Follow up Call-  Call back number 08/07/2018  Post procedure Call Back phone  # [phone]  Permission to leave phone message Yes     Patient questions:  Do you have a fever, pain , or abdominal swelling? No. Pain Score  0 *  Have you tolerated food without any problems? Yes.    Have you been able to return to your normal activities? Yes.    Do you have any questions about your discharge instructions: Diet   No. Medications  No. Follow up visit  No.  Do you have questions or concerns about your Care? No.  Actions: * If pain score is 4 or above: No action needed, pain <4.

## 2018-08-12 ENCOUNTER — Ambulatory Visit: Payer: BC Managed Care – PPO | Admitting: "Endocrinology

## 2018-08-17 ENCOUNTER — Encounter: Payer: Self-pay | Admitting: Gastroenterology

## 2018-09-02 ENCOUNTER — Other Ambulatory Visit: Payer: Self-pay | Admitting: "Endocrinology

## 2019-11-19 IMAGING — US US THYROID
1 series · 13 of 25 positions shown · non-contrast
Comparison: None.

CLINICAL DATA: 52-year-old female with a history of thyroid
nodules.

EXAM:
THYROID ULTRASOUND
TECHNIQUE: Ultrasound examination of the thyroid gland and adjacent soft
tissues was performed.

[Series 1: us thyroid · 0.06mm/px · 13 of 31 slices shown]
[im 1/31]
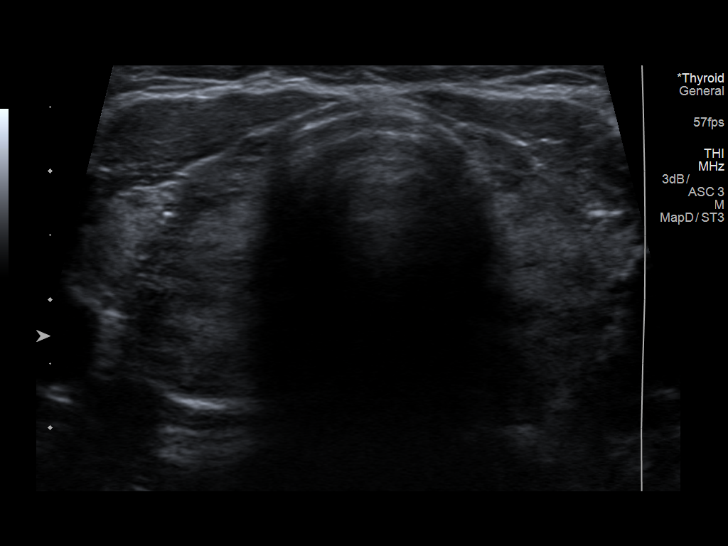
[im 3/31]
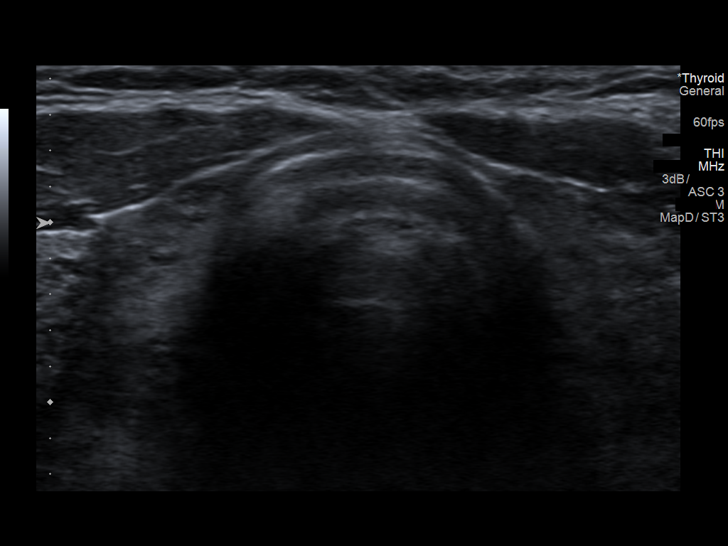
[im 6/31]
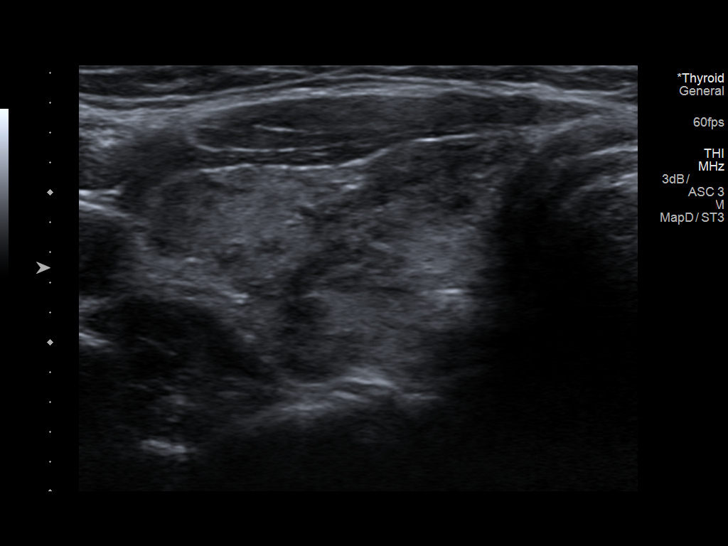
[im 8/31]
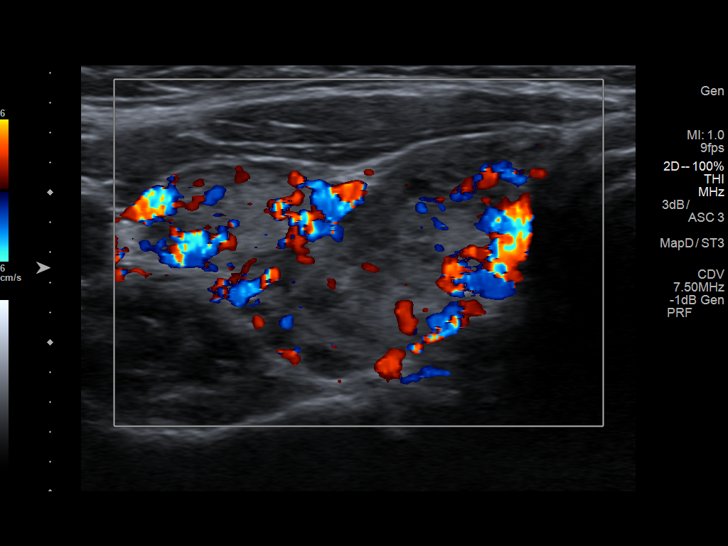
[im 11/31]
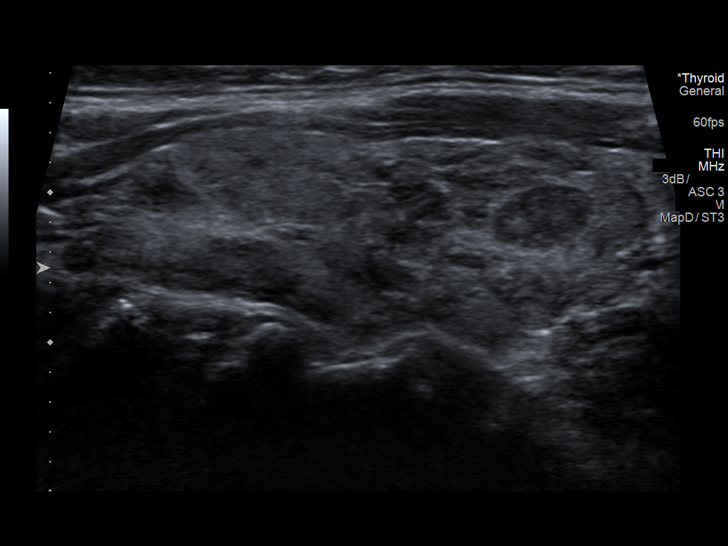
[im 13/31]
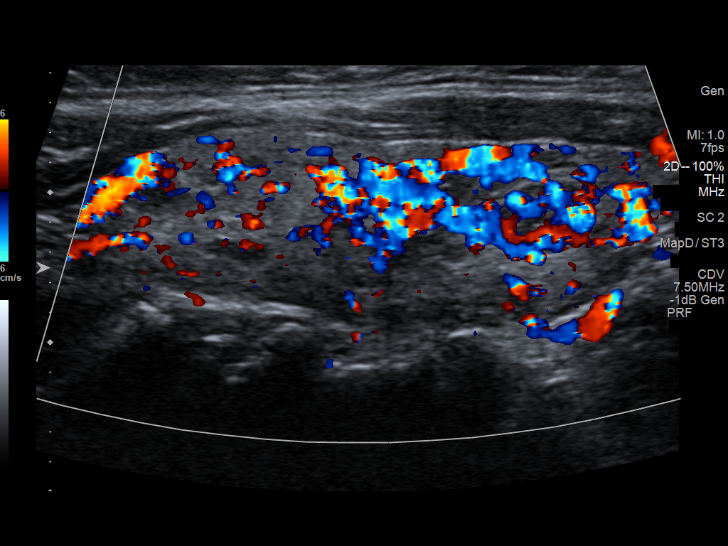
[im 16/31]
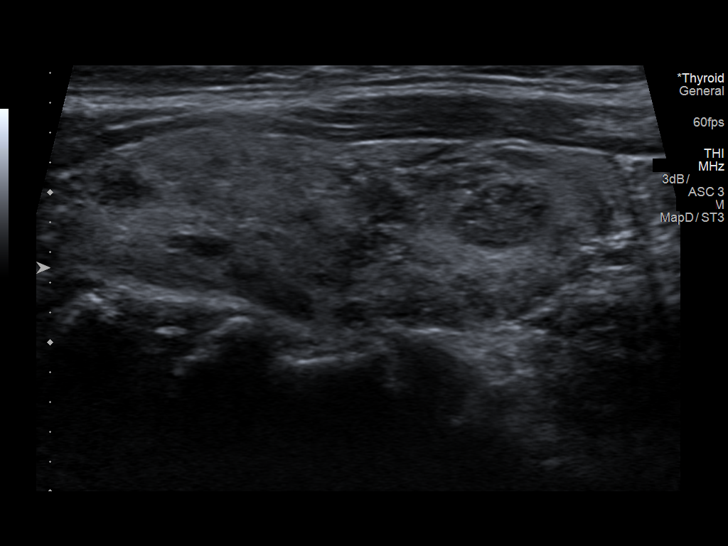
[im 18/31]
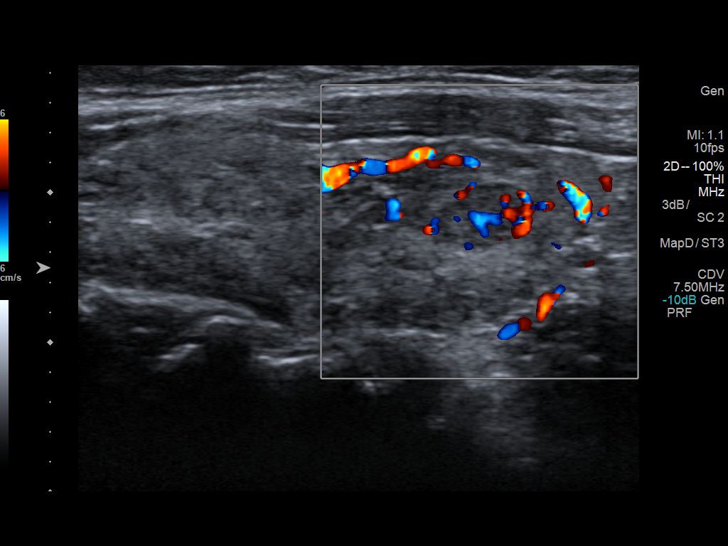
[im 21/31]
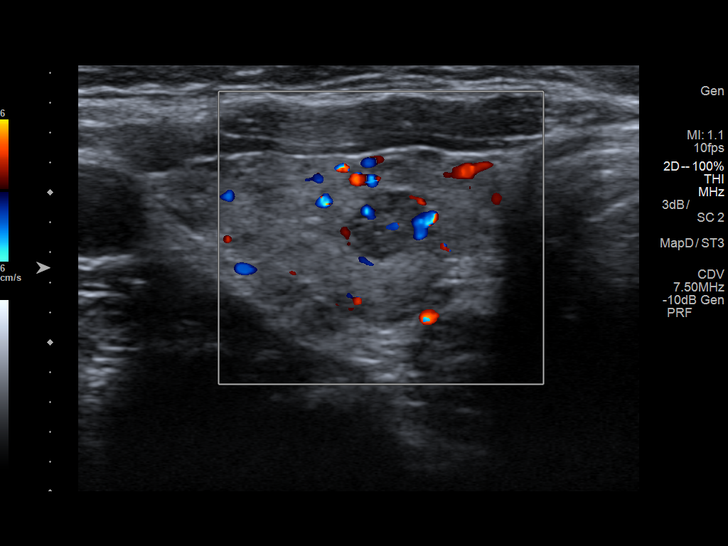
[im 23/31]
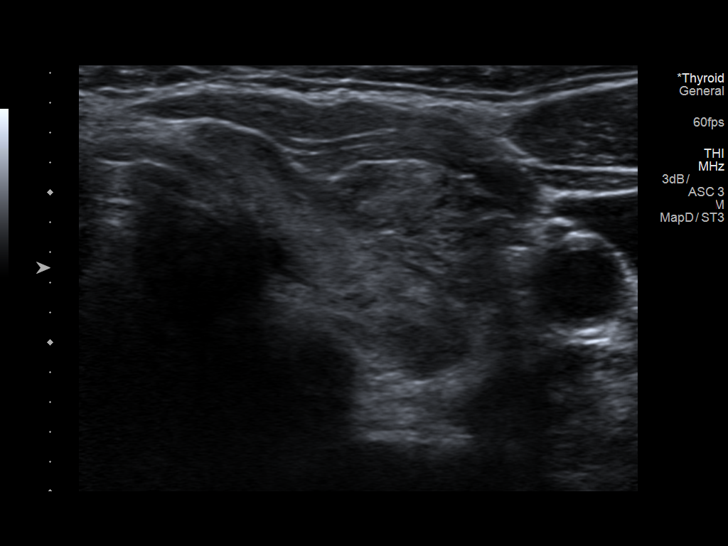
[im 26/31]
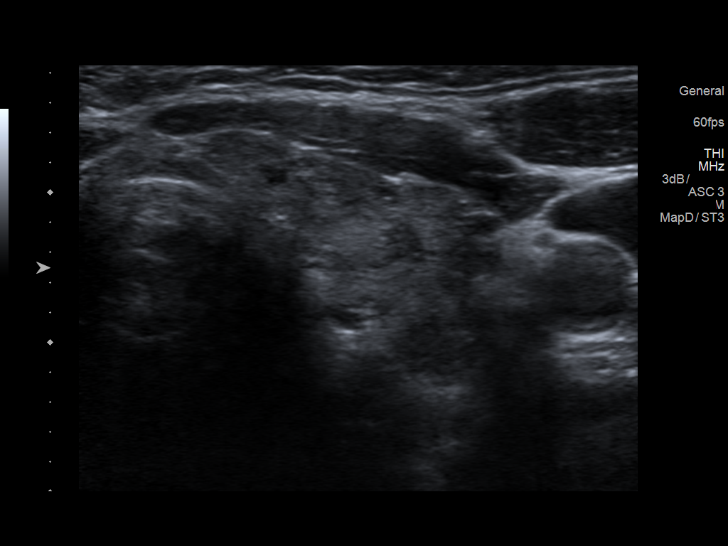
[im 28/31]
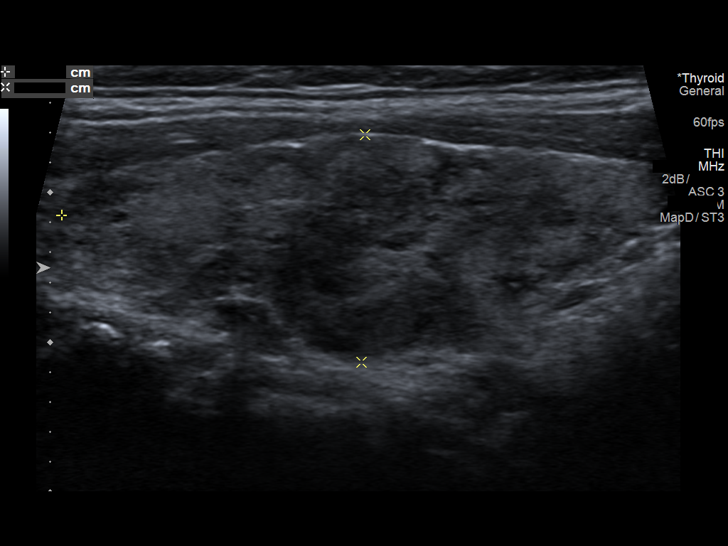
[im 31/31]
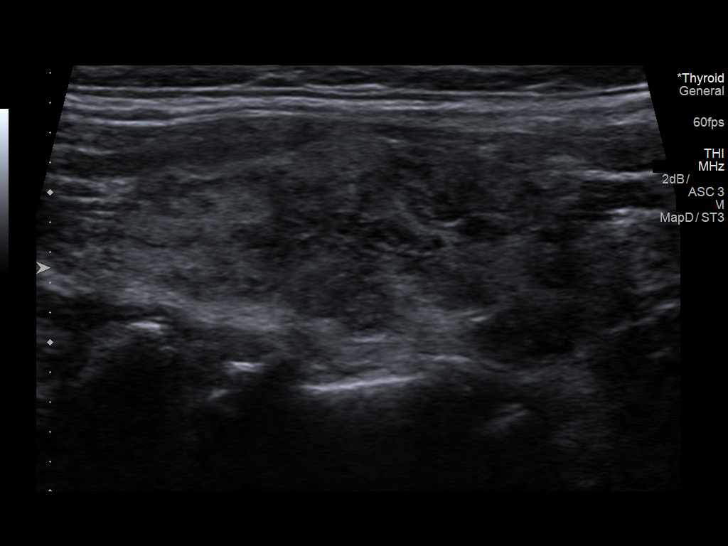

[13 of 25 positions shown; findings below may reference images not displayed]

FINDINGS: Parenchymal Echotexture: Mildly heterogenous

Isthmus: 0.1 cm

Right lobe: 4.0 cm x 1.4 cm x 2.3 cm

Left lobe: 4.1 cm x 1.5 cm x 1.6 cm

_________________________________________________________

Estimated total number of nodules >/= 1 cm: 0

Number of spongiform nodules >/=  2 cm not described below (TR1): 0

Number of mixed cystic and solid nodules >/= 1.5 cm not described
below (TR2): 0

_________________________________________________________

Nodule # 1:

Location: Right; Inferior

Maximum size: 0.7 cm; Other 2 dimensions: 0.6 cm x 0.5 cm

Composition: cannot determine (2)

Echogenicity: hypoechoic (2)

Shape: not taller-than-wide (0)

Margins: smooth (0)

Echogenic foci: none (0)

ACR TI-RADS total points: 4.

ACR TI-RADS risk category: TR4 (4-6 points).

ACR TI-RADS recommendations:

Nodule does not meet criteria for surveillance or biopsy

_________________________________________________________

No adenopathy
IMPRESSION: No thyroid nodule meets criteria for biopsy or surveillance, as
designated by the newly established ACR TI-RADS criteria.

Recommendations follow those established by the new ACR TI-RADS
criteria ([HOSPITAL] 8204;[DATE]).

## 2022-02-19 ENCOUNTER — Other Ambulatory Visit: Payer: Self-pay | Admitting: Internal Medicine

## 2022-02-19 DIAGNOSIS — Z139 Encounter for screening, unspecified: Secondary | ICD-10-CM

## 2022-03-06 ENCOUNTER — Ambulatory Visit
Admission: RE | Admit: 2022-03-06 | Discharge: 2022-03-06 | Disposition: A | Discharge status: Home / Self Care | Payer: BC Managed Care – PPO | Source: Ambulatory Visit | Attending: Internal Medicine | Admitting: Internal Medicine

## 2022-03-06 DIAGNOSIS — Z139 Encounter for screening, unspecified: Secondary | ICD-10-CM

## 2023-09-18 ENCOUNTER — Other Ambulatory Visit: Payer: Self-pay | Admitting: Internal Medicine

## 2023-09-18 DIAGNOSIS — Z1231 Encounter for screening mammogram for malignant neoplasm of breast: Secondary | ICD-10-CM

## 2023-09-30 ENCOUNTER — Ambulatory Visit
Admission: RE | Admit: 2023-09-30 | Discharge: 2023-09-30 | Disposition: A | Discharge status: Home / Self Care | Payer: 59 | Source: Ambulatory Visit | Attending: Internal Medicine | Admitting: Internal Medicine

## 2023-09-30 DIAGNOSIS — Z1231 Encounter for screening mammogram for malignant neoplasm of breast: Secondary | ICD-10-CM

## 2023-11-13 ENCOUNTER — Encounter: Payer: Self-pay | Admitting: Family Medicine

## 2024-05-10 ENCOUNTER — Ambulatory Visit (INDEPENDENT_AMBULATORY_CARE_PROVIDER_SITE_OTHER)

## 2024-05-10 ENCOUNTER — Ambulatory Visit
Admission: EM | Admit: 2024-05-10 | Discharge: 2024-05-10 | Disposition: A | Discharge status: Home / Self Care | Attending: Family Medicine | Admitting: Family Medicine

## 2024-05-10 DIAGNOSIS — R0789 Other chest pain: Secondary | ICD-10-CM

## 2024-05-10 DIAGNOSIS — R051 Acute cough: Secondary | ICD-10-CM

## 2024-05-10 DIAGNOSIS — J069 Acute upper respiratory infection, unspecified: Secondary | ICD-10-CM | POA: Diagnosis not present

## 2024-05-10 NOTE — ED Triage Notes (Signed)
 Pt being seen in UC for L sided chest tightness with movement and coughing, nasal congestion, some diarrhea, and productive cough that started Thursday. Pt denies fevers. Pt reports taking covid test at home, both were negative. Pt reports taking nyquil with no relief.

## 2024-05-10 NOTE — Discharge Instructions (Signed)
 Your x-ray came back without any acute abnormalities.  I am hopeful that your pain is more musculoskeletal, try heating pads, over-the-counter pain relievers, massage, stretches and follow-up for worsening or unresolving symptoms

## 2024-05-11 NOTE — ED Provider Notes (Signed)
 [facility]    CSN: [id number] Arrival date & time: 05/10/24  1828      History   Chief Complaint Chief Complaint  Patient presents with   Cough   Nasal Congestion   Chest Pain    HPI Kimberly Daugherty is a 58 y.o. female.   Patient presenting today with left-sided chest and lateral rib pain worse with movement for the past day, coughing and congestion and diarrhea for about 5 days.  She states she took 2 COVID Test at home since onset of symptoms and both have been negative.  Tried DayQuil and NyQuil with mild relief.  Thinks that her upper respiratory symptoms are improving but became concerned when she started having the left-sided chest pain so wanted to get checked out.  Denies history of chronic pulmonary disease or cardiac issues known.    Past Medical History:  Diagnosis Date   Anemia    Hemorrhoids    Hypothyroidism     Patient Active Problem List   Diagnosis Date Noted   Class 1 obesity due to excess calories without serious comorbidity with body mass index (BMI) of 30.0 to 30.9 in adult 05/19/2018   Hypothyroidism due to Hashimoto's thyroiditis 01/13/2018   Heme + stool 07/15/2016    Past Surgical History:  Procedure Laterality Date   VAGINAL HYSTERECTOMY  2003    OB History     Gravida  0   Para  0   Term  0   Preterm  0   AB  0   Living  0      SAB  0   IAB  0   Ectopic  0   Multiple  0   Live Births  0            Home Medications    Prior to Admission medications   Medication Sig Start Date End Date Taking? Authorizing Provider  levothyroxine  (SYNTHROID , LEVOTHROID) 75 MCG tablet TAKE 1 TABLET BY MOUTH EACH MORNING BEFORE BREAKFAST. 09/03/18   Nida, Gebreselassie W, MD  Lorcaserin  HCl ER (BELVIQ XR) 20 MG TB24 Take 20 mg by mouth daily with breakfast. Patient not taking: Reported on 07/28/2018 05/27/18   Nida, Gebreselassie W, MD  phentermine  (ADIPEX-P ) 37.5 MG capsule Take by mouth. Patient not taking: Reported on  05/10/2024 12/06/16   [provider]  valACYclovir (VALTREX) 1000 MG tablet Take by mouth. 12/06/16   [provider]    Family History Family History  Problem Relation Age of Onset   Leukemia Mother    Other Mother        platelet disorder   Cancer Father        lung   Lung cancer Father    Anal fissures Father    Hypothyroidism Sister    Stroke Maternal Grandmother    Stroke Maternal Grandfather    Colon cancer Neg Hx    Colon polyps Neg Hx    Esophageal cancer Neg Hx    Rectal cancer Neg Hx    Stomach cancer Neg Hx    Breast cancer Neg Hx     Social History Social History   Tobacco Use   Smoking status: Never   Smokeless tobacco: Never  Vaping Use   Vaping status: Never Used  Substance Use Topics   Alcohol use: Yes    Comment: Occasional, about once a week 1-2 drinks per sitting.   Drug use: No     Allergies   Patient has no  known allergies.   Review of Systems Review of Systems Per HPI  Physical Exam Triage Vital Signs ED Triage Vitals [05/10/24 1838]  Encounter Vitals Group     BP (!) 141/90     Girls Systolic BP Percentile      Girls Diastolic BP Percentile      Boys Systolic BP Percentile      Boys Diastolic BP Percentile      Pulse Rate 84     Resp 18     Temp 97.8 F (36.6 C)     Temp Source Oral     SpO2 92 %     Weight      Height      Head Circumference      Peak Flow      Pain Score 4     Pain Loc      Pain Education      Exclude from Growth Chart    No data found.  Updated Vital Signs BP (!) 141/90 (BP Location: Right Arm)   Pulse 84   Temp 97.8 F (36.6 C) (Oral)   Resp 18   SpO2 92%   Visual Acuity Right Eye Distance:   Left Eye Distance:   Bilateral Distance:    Right Eye Near:   Left Eye Near:    Bilateral Near:     Physical Exam Vitals and nursing note reviewed.  Constitutional:      Appearance: Normal appearance. She is not ill-appearing.  HENT:     Head: Atraumatic.     Right Ear:  Tympanic membrane normal.     Left Ear: Tympanic membrane normal.     Nose: Nose normal.     Mouth/Throat:     Mouth: Mucous membranes are moist.     Pharynx: Oropharynx is clear. No posterior oropharyngeal erythema.  Eyes:     Extraocular Movements: Extraocular movements intact.     Conjunctiva/sclera: Conjunctivae normal.  Cardiovascular:     Rate and Rhythm: Normal rate and regular rhythm.     Heart sounds: Normal heart sounds.  Pulmonary:     Effort: Pulmonary effort is normal.     Breath sounds: Normal breath sounds. No wheezing or rales.     Comments: Chest ray symmetric bilaterally Musculoskeletal:        General: Tenderness present. Normal range of motion.     Cervical back: Normal range of motion and neck supple.     Comments: Mild tenderness to palpation over the left chest wall and left lateral rib region in the area of her pain  Skin:    General: Skin is warm and dry.  Neurological:     Mental Status: She is alert and oriented to person, place, and time.     Motor: No weakness.     Gait: Gait normal.  Psychiatric:        Mood and Affect: Mood normal.        Thought Content: Thought content normal.        Judgment: Judgment normal.      UC Treatments / Results  Labs (all labs ordered are listed, but only abnormal results are displayed) Labs Reviewed - No data to display  EKG   Radiology DG Chest 2 View Result Date: 05/10/2024 EXAM: 2 VIEW(S) XRAY OF THE CHEST 05/10/2024 07:37:06 PM COMPARISON: None available. CLINICAL HISTORY: Left sided lower chest pain after 1 week URI. FINDINGS: LUNGS AND PLEURA: No focal pulmonary opacity. No pulmonary edema. No pleural effusion.  No pneumothorax. HEART AND MEDIASTINUM: No acute abnormality of the cardiac and mediastinal silhouettes. BONES AND SOFT TISSUES: Degenerative changes of spine. IMPRESSION: 1. No acute process. Electronically signed by: Dorethia Molt MD 05/10/2024 07:51 PM EDT RP Workstation: [id number]     Procedures Procedures (including critical care time)  Medications Ordered in UC Medications - No data to display  Initial Impression / Assessment and Plan / UC Course  I have reviewed the triage vital signs and the nursing notes.  Pertinent labs & imaging results that were available during my care of the patient were reviewed by me and considered in my medical decision making (see chart for details).     Vital signs and exam overall reassuring today, she is well-appearing and in no acute distress.  Chest x-ray today showing no acute cardiopulmonary abnormality.  She declines EKG or other further evaluation at this time today with shared decision making.  Suspect muscular soreness possibly from coughing from recent viral respiratory infection.  Discussed supportive over-the-counter medications, home care and return precautions.  Final Clinical Impressions(s) / UC Diagnoses   Final diagnoses:  Acute cough  Left-sided chest wall pain  Viral URI with cough     Discharge Instructions      Your x-ray came back without any acute abnormalities.  I am hopeful that your pain is more musculoskeletal, try heating pads, over-the-counter pain relievers, massage, stretches and follow-up for worsening or unresolving symptoms    ED Prescriptions   None    PDMP not reviewed this encounter.   Stuart Vernell Norris, [place] 05/11/24 1631
